# Patient Record
Sex: Male | Born: 1960
Health system: Southern US, Community
[De-identification: ages and names within clinical notes are randomized; demographics above are authoritative.]

---

## 2007-07-28 ENCOUNTER — Ambulatory Visit: Payer: Self-pay | Admitting: Internal Medicine

## 2007-07-28 DIAGNOSIS — R0602 Shortness of breath: Secondary | ICD-10-CM | POA: Insufficient documentation

## 2007-07-28 DIAGNOSIS — M6790 Unspecified disorder of synovium and tendon, unspecified site: Secondary | ICD-10-CM | POA: Insufficient documentation

## 2007-07-28 DIAGNOSIS — M25569 Pain in unspecified knee: Secondary | ICD-10-CM | POA: Insufficient documentation

## 2007-07-28 DIAGNOSIS — R5381 Other malaise: Secondary | ICD-10-CM

## 2007-07-28 DIAGNOSIS — K219 Gastro-esophageal reflux disease without esophagitis: Secondary | ICD-10-CM

## 2007-07-28 DIAGNOSIS — R5383 Other fatigue: Secondary | ICD-10-CM

## 2007-07-28 DIAGNOSIS — M719 Bursopathy, unspecified: Secondary | ICD-10-CM

## 2007-07-28 LAB — CONVERTED CEMR LAB: Blood Glucose, Fingerstick: 90

## 2007-07-29 ENCOUNTER — Encounter (INDEPENDENT_AMBULATORY_CARE_PROVIDER_SITE_OTHER): Payer: Self-pay | Admitting: Internal Medicine

## 2007-08-01 ENCOUNTER — Ambulatory Visit: Admission: RE | Admit: 2007-08-01 | Discharge: 2007-08-01 | Payer: Self-pay | Admitting: Internal Medicine

## 2007-08-01 ENCOUNTER — Encounter (INDEPENDENT_AMBULATORY_CARE_PROVIDER_SITE_OTHER): Payer: Self-pay | Admitting: Internal Medicine

## 2007-08-02 LAB — CONVERTED CEMR LAB
Chloride: 104 meq/L (ref 96–112)
Cholesterol: 162 mg/dL (ref 0–200)
HCT: 52.1 % — ABNORMAL HIGH (ref 39.0–52.0)
Hemoglobin: 16.2 g/dL (ref 13.0–17.0)
Lymphocytes Relative: 25 % (ref 12–46)
Lymphs Abs: 2.5 10*3/uL (ref 0.7–4.0)
MCHC: 31.1 g/dL (ref 30.0–36.0)
MCV: 94.4 fL (ref 78.0–100.0)
Monocytes Absolute: 0.8 10*3/uL (ref 0.1–1.0)
Monocytes Relative: 8 % (ref 3–12)
Neutrophils Relative %: 64 % (ref 43–77)
Platelets: 241 10*3/uL (ref 150–400)
Sodium: 138 meq/L (ref 135–145)
Triglycerides: 395 mg/dL — ABNORMAL HIGH (ref <150)

## 2007-08-16 ENCOUNTER — Ambulatory Visit: Payer: Self-pay | Admitting: Pulmonary Disease

## 2007-08-17 ENCOUNTER — Telehealth (INDEPENDENT_AMBULATORY_CARE_PROVIDER_SITE_OTHER): Payer: Self-pay | Admitting: *Deleted

## 2007-08-24 ENCOUNTER — Telehealth (INDEPENDENT_AMBULATORY_CARE_PROVIDER_SITE_OTHER): Payer: Self-pay | Admitting: *Deleted

## 2007-08-30 ENCOUNTER — Encounter (INDEPENDENT_AMBULATORY_CARE_PROVIDER_SITE_OTHER): Payer: Self-pay | Admitting: Internal Medicine

## 2007-11-03 ENCOUNTER — Telehealth (INDEPENDENT_AMBULATORY_CARE_PROVIDER_SITE_OTHER): Payer: Self-pay | Admitting: *Deleted

## 2007-11-05 ENCOUNTER — Ambulatory Visit: Payer: Self-pay | Admitting: Internal Medicine

## 2007-11-05 DIAGNOSIS — R21 Rash and other nonspecific skin eruption: Secondary | ICD-10-CM

## 2007-11-10 ENCOUNTER — Encounter (INDEPENDENT_AMBULATORY_CARE_PROVIDER_SITE_OTHER): Payer: Self-pay | Admitting: Internal Medicine

## 2007-11-12 ENCOUNTER — Ambulatory Visit (HOSPITAL_COMMUNITY): Admission: RE | Admit: 2007-11-12 | Discharge: 2007-11-12 | Payer: Self-pay | Admitting: Orthopaedic Surgery

## 2007-12-17 ENCOUNTER — Encounter (INDEPENDENT_AMBULATORY_CARE_PROVIDER_SITE_OTHER): Payer: Self-pay | Admitting: Internal Medicine

## 2008-01-26 ENCOUNTER — Ambulatory Visit: Payer: Self-pay | Admitting: Internal Medicine

## 2008-02-07 ENCOUNTER — Encounter (INDEPENDENT_AMBULATORY_CARE_PROVIDER_SITE_OTHER): Payer: Self-pay | Admitting: Internal Medicine

## 2008-08-18 ENCOUNTER — Encounter (INDEPENDENT_AMBULATORY_CARE_PROVIDER_SITE_OTHER): Payer: Self-pay | Admitting: Internal Medicine

## 2008-09-19 ENCOUNTER — Encounter: Admission: RE | Admit: 2008-09-19 | Discharge: 2008-12-18 | Payer: Self-pay | Admitting: Physician Assistant

## 2009-04-02 ENCOUNTER — Emergency Department (HOSPITAL_COMMUNITY): Admission: EM | Admit: 2009-04-02 | Discharge: 2009-04-02 | Payer: Self-pay | Admitting: Emergency Medicine

## 2010-07-09 NOTE — Procedures (Signed)
[name], Gabriel Dean                ACCOUNT NO.:  [id number]   MEDICAL RECORD NO.:  [id number]          PATIENT TYPE:  OUT   LOCATION:  SLEEP LAB                     FACILITY:  [facility]   PHYSICIAN:  Barbaraann Share, MD,FCCPDATE OF BIRTH:  01/07/60   DATE OF STUDY:  08/01/2007                            NOCTURNAL POLYSOMNOGRAM   REFERRING PHYSICIAN:   REFERRING PHYSICIAN:  Dr. Erle Crocker   INDICATION FOR STUDY:  Hypersomnia with sleep apnea.   EPWORTH SLEEPINESS SCORE:  Epworth score:  19.   SLEEP ARCHITECTURE:  The patient was found to have a total sleep time of  93 minutes during the diagnostic portion of the study, and 209 minutes  during the titration portion.  There was no slow wave, sleep or REM  achieved during the diagnostic portion and small quantities noted during  the titration portion.  Sleep onset latency was normal at 25 minutes and  sleep efficiency was poor during both the diagnostic and titration  portion.   RESPIRATORY DATA:  The patient underwent a split night protocol where he  was found to have 35 obstructive events in the first 93 minutes of  sleep.  This gave him an apnea/hypopnea index of 23 events per hour  during the diagnostic portion.  Very loud snoring was noted and the  events were not positional.  By protocol the patient was then placed on  a medium Quattro full-face mask, and ultimately titrated to a final  pressure of 8 cm.  Tolerance appeared to be excellent.   OXYGEN DATA:  The patient had O2 desaturation transiently as low as 89%  with the obstructive events.   CARDIAC DATA:  The patient was found to have a few runs of PVCs versus  ST-T with aberrancy.  There were no clinically significant arrhythmias  noted.   MOVEMENT-PARASOMNIA:  The patient had no clinically significant leg  jerks or abnormal behaviors.   IMPRESSIONS-RECOMMENDATIONS:  1. Moderate obstructive sleep apnea/hypopnea syndrome with an      apnea/hypopnea index of 23  events per hour and O2 desaturation as      low as 89% during the diagnostic portion of the study.  The patient      was then placed on CPAP with a medium Quattro full-face mask and      ultimately titrated to a final pressure of 8 cm of water.  The      patient should also be encouraged to work aggressively on weight      loss.  2. Occasional runs of PVCs versus SVT with aberrancy, but no      clinically significant arrhythmia noted.      Barbaraann Share, MD,FCCP  Diplomate, American Board of Sleep  Medicine  Electronically Signed     [facility]  D:  08/17/2007 08:14:17  T:  08/17/2007 08:29:02  Job:  [id number]
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
## 2019-08-18 DIAGNOSIS — I872 Venous insufficiency (chronic) (peripheral): Secondary | ICD-10-CM | POA: Diagnosis not present

## 2019-08-18 DIAGNOSIS — Z125 Encounter for screening for malignant neoplasm of prostate: Secondary | ICD-10-CM | POA: Diagnosis not present

## 2019-08-18 DIAGNOSIS — R739 Hyperglycemia, unspecified: Secondary | ICD-10-CM | POA: Diagnosis not present

## 2019-08-18 DIAGNOSIS — Z87891 Personal history of nicotine dependence: Secondary | ICD-10-CM | POA: Diagnosis not present

## 2019-08-18 DIAGNOSIS — R609 Edema, unspecified: Secondary | ICD-10-CM | POA: Diagnosis not present

## 2020-04-03 DIAGNOSIS — G8929 Other chronic pain: Secondary | ICD-10-CM | POA: Diagnosis not present

## 2020-04-03 DIAGNOSIS — Z809 Family history of malignant neoplasm, unspecified: Secondary | ICD-10-CM | POA: Diagnosis not present

## 2020-04-03 DIAGNOSIS — Z6838 Body mass index (BMI) 38.0-38.9, adult: Secondary | ICD-10-CM | POA: Diagnosis not present

## 2020-04-03 DIAGNOSIS — E119 Type 2 diabetes mellitus without complications: Secondary | ICD-10-CM | POA: Diagnosis not present

## 2020-04-03 DIAGNOSIS — Z8249 Family history of ischemic heart disease and other diseases of the circulatory system: Secondary | ICD-10-CM | POA: Diagnosis not present

## 2020-04-03 DIAGNOSIS — R609 Edema, unspecified: Secondary | ICD-10-CM | POA: Diagnosis not present

## 2020-04-03 DIAGNOSIS — R030 Elevated blood-pressure reading, without diagnosis of hypertension: Secondary | ICD-10-CM | POA: Diagnosis not present

## 2020-04-03 DIAGNOSIS — R69 Illness, unspecified: Secondary | ICD-10-CM | POA: Diagnosis not present

## 2020-04-03 DIAGNOSIS — Z7984 Long term (current) use of oral hypoglycemic drugs: Secondary | ICD-10-CM | POA: Diagnosis not present

## 2020-05-10 DIAGNOSIS — E785 Hyperlipidemia, unspecified: Secondary | ICD-10-CM | POA: Diagnosis not present

## 2020-05-10 DIAGNOSIS — Z125 Encounter for screening for malignant neoplasm of prostate: Secondary | ICD-10-CM | POA: Diagnosis not present

## 2020-05-10 DIAGNOSIS — Z0000 Encounter for general adult medical examination without abnormal findings: Secondary | ICD-10-CM | POA: Diagnosis not present

## 2020-05-10 DIAGNOSIS — E119 Type 2 diabetes mellitus without complications: Secondary | ICD-10-CM | POA: Diagnosis not present

## 2020-05-28 DIAGNOSIS — R0602 Shortness of breath: Secondary | ICD-10-CM | POA: Diagnosis not present

## 2020-05-28 DIAGNOSIS — R079 Chest pain, unspecified: Secondary | ICD-10-CM | POA: Diagnosis not present

## 2020-05-28 DIAGNOSIS — K219 Gastro-esophageal reflux disease without esophagitis: Secondary | ICD-10-CM | POA: Diagnosis not present

## 2020-05-28 DIAGNOSIS — M810 Age-related osteoporosis without current pathological fracture: Secondary | ICD-10-CM | POA: Diagnosis not present

## 2020-05-28 DIAGNOSIS — R0789 Other chest pain: Secondary | ICD-10-CM | POA: Diagnosis not present

## 2020-05-28 DIAGNOSIS — Z7984 Long term (current) use of oral hypoglycemic drugs: Secondary | ICD-10-CM | POA: Diagnosis not present

## 2020-05-28 DIAGNOSIS — R5383 Other fatigue: Secondary | ICD-10-CM | POA: Diagnosis not present

## 2020-05-28 DIAGNOSIS — I10 Essential (primary) hypertension: Secondary | ICD-10-CM | POA: Diagnosis not present

## 2020-05-28 DIAGNOSIS — R600 Localized edema: Secondary | ICD-10-CM | POA: Diagnosis not present

## 2020-05-28 DIAGNOSIS — E1140 Type 2 diabetes mellitus with diabetic neuropathy, unspecified: Secondary | ICD-10-CM | POA: Diagnosis not present

## 2020-05-28 DIAGNOSIS — E119 Type 2 diabetes mellitus without complications: Secondary | ICD-10-CM | POA: Diagnosis not present

## 2020-05-28 DIAGNOSIS — Z6841 Body Mass Index (BMI) 40.0 and over, adult: Secondary | ICD-10-CM | POA: Diagnosis not present

## 2020-05-28 DIAGNOSIS — Z79899 Other long term (current) drug therapy: Secondary | ICD-10-CM | POA: Diagnosis not present

## 2020-05-28 DIAGNOSIS — E669 Obesity, unspecified: Secondary | ICD-10-CM | POA: Diagnosis not present

## 2020-05-29 DIAGNOSIS — R079 Chest pain, unspecified: Secondary | ICD-10-CM | POA: Diagnosis not present

## 2020-05-30 DIAGNOSIS — R079 Chest pain, unspecified: Secondary | ICD-10-CM | POA: Diagnosis not present

## 2020-06-04 DIAGNOSIS — E1169 Type 2 diabetes mellitus with other specified complication: Secondary | ICD-10-CM | POA: Diagnosis not present

## 2020-06-04 DIAGNOSIS — I10 Essential (primary) hypertension: Secondary | ICD-10-CM | POA: Diagnosis not present

## 2020-06-04 DIAGNOSIS — R079 Chest pain, unspecified: Secondary | ICD-10-CM | POA: Diagnosis not present

## 2020-06-04 DIAGNOSIS — E785 Hyperlipidemia, unspecified: Secondary | ICD-10-CM | POA: Diagnosis not present

## 2020-06-14 DIAGNOSIS — E785 Hyperlipidemia, unspecified: Secondary | ICD-10-CM | POA: Diagnosis not present

## 2020-06-14 DIAGNOSIS — E1169 Type 2 diabetes mellitus with other specified complication: Secondary | ICD-10-CM | POA: Diagnosis not present

## 2020-06-14 DIAGNOSIS — I10 Essential (primary) hypertension: Secondary | ICD-10-CM | POA: Diagnosis not present

## 2020-06-14 DIAGNOSIS — R609 Edema, unspecified: Secondary | ICD-10-CM | POA: Diagnosis not present

## 2020-06-14 DIAGNOSIS — R079 Chest pain, unspecified: Secondary | ICD-10-CM | POA: Diagnosis not present

## 2020-06-27 DIAGNOSIS — I2510 Atherosclerotic heart disease of native coronary artery without angina pectoris: Secondary | ICD-10-CM | POA: Diagnosis not present

## 2020-06-27 DIAGNOSIS — I2583 Coronary atherosclerosis due to lipid rich plaque: Secondary | ICD-10-CM | POA: Diagnosis not present

## 2020-06-27 DIAGNOSIS — E785 Hyperlipidemia, unspecified: Secondary | ICD-10-CM | POA: Diagnosis not present

## 2020-06-27 DIAGNOSIS — R079 Chest pain, unspecified: Secondary | ICD-10-CM | POA: Diagnosis not present

## 2020-06-27 DIAGNOSIS — E1169 Type 2 diabetes mellitus with other specified complication: Secondary | ICD-10-CM | POA: Diagnosis not present

## 2020-06-27 DIAGNOSIS — I10 Essential (primary) hypertension: Secondary | ICD-10-CM | POA: Diagnosis not present

## 2020-06-27 DIAGNOSIS — R911 Solitary pulmonary nodule: Secondary | ICD-10-CM | POA: Diagnosis not present

## 2020-07-03 DIAGNOSIS — M1990 Unspecified osteoarthritis, unspecified site: Secondary | ICD-10-CM | POA: Diagnosis not present

## 2020-07-03 DIAGNOSIS — E785 Hyperlipidemia, unspecified: Secondary | ICD-10-CM | POA: Diagnosis not present

## 2020-07-03 DIAGNOSIS — Z7984 Long term (current) use of oral hypoglycemic drugs: Secondary | ICD-10-CM | POA: Diagnosis not present

## 2020-07-03 DIAGNOSIS — R0789 Other chest pain: Secondary | ICD-10-CM | POA: Diagnosis not present

## 2020-07-03 DIAGNOSIS — R69 Illness, unspecified: Secondary | ICD-10-CM | POA: Diagnosis not present

## 2020-07-03 DIAGNOSIS — I2510 Atherosclerotic heart disease of native coronary artery without angina pectoris: Secondary | ICD-10-CM | POA: Diagnosis not present

## 2020-07-03 DIAGNOSIS — Z7982 Long term (current) use of aspirin: Secondary | ICD-10-CM | POA: Diagnosis not present

## 2020-07-03 DIAGNOSIS — Z79899 Other long term (current) drug therapy: Secondary | ICD-10-CM | POA: Diagnosis not present

## 2020-07-03 DIAGNOSIS — Z96651 Presence of right artificial knee joint: Secondary | ICD-10-CM | POA: Diagnosis not present

## 2020-07-03 DIAGNOSIS — E119 Type 2 diabetes mellitus without complications: Secondary | ICD-10-CM | POA: Diagnosis not present

## 2020-07-04 DIAGNOSIS — Z8719 Personal history of other diseases of the digestive system: Secondary | ICD-10-CM | POA: Diagnosis not present

## 2020-07-04 DIAGNOSIS — R69 Illness, unspecified: Secondary | ICD-10-CM | POA: Diagnosis not present

## 2020-07-04 DIAGNOSIS — Z79899 Other long term (current) drug therapy: Secondary | ICD-10-CM | POA: Diagnosis not present

## 2020-07-04 DIAGNOSIS — K219 Gastro-esophageal reflux disease without esophagitis: Secondary | ICD-10-CM | POA: Diagnosis not present

## 2020-07-04 DIAGNOSIS — Z7984 Long term (current) use of oral hypoglycemic drugs: Secondary | ICD-10-CM | POA: Diagnosis not present

## 2020-07-04 DIAGNOSIS — E1169 Type 2 diabetes mellitus with other specified complication: Secondary | ICD-10-CM | POA: Diagnosis not present

## 2020-07-04 DIAGNOSIS — M1990 Unspecified osteoarthritis, unspecified site: Secondary | ICD-10-CM | POA: Diagnosis not present

## 2020-07-04 DIAGNOSIS — E119 Type 2 diabetes mellitus without complications: Secondary | ICD-10-CM | POA: Diagnosis not present

## 2020-07-04 DIAGNOSIS — E785 Hyperlipidemia, unspecified: Secondary | ICD-10-CM | POA: Diagnosis not present

## 2020-07-04 DIAGNOSIS — Z87891 Personal history of nicotine dependence: Secondary | ICD-10-CM | POA: Diagnosis not present

## 2020-07-04 DIAGNOSIS — R0789 Other chest pain: Secondary | ICD-10-CM | POA: Diagnosis not present

## 2020-07-04 DIAGNOSIS — I2510 Atherosclerotic heart disease of native coronary artery without angina pectoris: Secondary | ICD-10-CM | POA: Diagnosis not present

## 2020-07-04 DIAGNOSIS — Z96651 Presence of right artificial knee joint: Secondary | ICD-10-CM | POA: Diagnosis not present

## 2020-07-04 DIAGNOSIS — I10 Essential (primary) hypertension: Secondary | ICD-10-CM | POA: Diagnosis not present

## 2020-07-04 DIAGNOSIS — Z7982 Long term (current) use of aspirin: Secondary | ICD-10-CM | POA: Diagnosis not present

## 2020-07-06 DIAGNOSIS — R609 Edema, unspecified: Secondary | ICD-10-CM | POA: Diagnosis not present

## 2020-08-03 DIAGNOSIS — E785 Hyperlipidemia, unspecified: Secondary | ICD-10-CM | POA: Diagnosis not present

## 2020-08-03 DIAGNOSIS — E1169 Type 2 diabetes mellitus with other specified complication: Secondary | ICD-10-CM | POA: Diagnosis not present

## 2020-08-07 DIAGNOSIS — R079 Chest pain, unspecified: Secondary | ICD-10-CM | POA: Diagnosis not present

## 2020-08-07 DIAGNOSIS — I2510 Atherosclerotic heart disease of native coronary artery without angina pectoris: Secondary | ICD-10-CM | POA: Diagnosis not present

## 2020-08-07 DIAGNOSIS — E785 Hyperlipidemia, unspecified: Secondary | ICD-10-CM | POA: Diagnosis not present

## 2020-08-07 DIAGNOSIS — I10 Essential (primary) hypertension: Secondary | ICD-10-CM | POA: Diagnosis not present

## 2020-08-07 DIAGNOSIS — E1169 Type 2 diabetes mellitus with other specified complication: Secondary | ICD-10-CM | POA: Diagnosis not present

## 2020-12-03 DIAGNOSIS — I10 Essential (primary) hypertension: Secondary | ICD-10-CM | POA: Diagnosis not present

## 2020-12-03 DIAGNOSIS — E1169 Type 2 diabetes mellitus with other specified complication: Secondary | ICD-10-CM | POA: Diagnosis not present

## 2020-12-03 DIAGNOSIS — E785 Hyperlipidemia, unspecified: Secondary | ICD-10-CM | POA: Diagnosis not present

## 2020-12-03 DIAGNOSIS — I2510 Atherosclerotic heart disease of native coronary artery without angina pectoris: Secondary | ICD-10-CM | POA: Diagnosis not present

## 2021-02-27 DIAGNOSIS — Z0000 Encounter for general adult medical examination without abnormal findings: Secondary | ICD-10-CM | POA: Diagnosis not present

## 2021-02-27 DIAGNOSIS — M5416 Radiculopathy, lumbar region: Secondary | ICD-10-CM | POA: Diagnosis not present

## 2021-02-27 DIAGNOSIS — E785 Hyperlipidemia, unspecified: Secondary | ICD-10-CM | POA: Diagnosis not present

## 2021-02-27 DIAGNOSIS — I10 Essential (primary) hypertension: Secondary | ICD-10-CM | POA: Diagnosis not present

## 2021-02-27 DIAGNOSIS — E1169 Type 2 diabetes mellitus with other specified complication: Secondary | ICD-10-CM | POA: Diagnosis not present

## 2021-02-27 DIAGNOSIS — Z87891 Personal history of nicotine dependence: Secondary | ICD-10-CM | POA: Diagnosis not present

## 2021-02-27 DIAGNOSIS — Z122 Encounter for screening for malignant neoplasm of respiratory organs: Secondary | ICD-10-CM | POA: Diagnosis not present

## 2021-03-21 DIAGNOSIS — Z87891 Personal history of nicotine dependence: Secondary | ICD-10-CM | POA: Diagnosis not present

## 2021-03-21 DIAGNOSIS — Z122 Encounter for screening for malignant neoplasm of respiratory organs: Secondary | ICD-10-CM | POA: Diagnosis not present

## 2021-05-15 DIAGNOSIS — Z6841 Body Mass Index (BMI) 40.0 and over, adult: Secondary | ICD-10-CM | POA: Diagnosis not present

## 2021-05-15 DIAGNOSIS — I10 Essential (primary) hypertension: Secondary | ICD-10-CM | POA: Diagnosis not present

## 2021-05-15 DIAGNOSIS — E1151 Type 2 diabetes mellitus with diabetic peripheral angiopathy without gangrene: Secondary | ICD-10-CM | POA: Diagnosis not present

## 2021-05-15 DIAGNOSIS — R609 Edema, unspecified: Secondary | ICD-10-CM | POA: Diagnosis not present

## 2021-05-15 DIAGNOSIS — E1140 Type 2 diabetes mellitus with diabetic neuropathy, unspecified: Secondary | ICD-10-CM | POA: Diagnosis not present

## 2021-05-15 DIAGNOSIS — Z7902 Long term (current) use of antithrombotics/antiplatelets: Secondary | ICD-10-CM | POA: Diagnosis not present

## 2021-05-15 DIAGNOSIS — E785 Hyperlipidemia, unspecified: Secondary | ICD-10-CM | POA: Diagnosis not present

## 2021-05-15 DIAGNOSIS — Z720 Tobacco use: Secondary | ICD-10-CM | POA: Diagnosis not present

## 2021-05-15 DIAGNOSIS — G8929 Other chronic pain: Secondary | ICD-10-CM | POA: Diagnosis not present

## 2021-05-15 DIAGNOSIS — Z596 Low income: Secondary | ICD-10-CM | POA: Diagnosis not present

## 2021-05-15 DIAGNOSIS — I25119 Atherosclerotic heart disease of native coronary artery with unspecified angina pectoris: Secondary | ICD-10-CM | POA: Diagnosis not present

## 2021-06-04 DIAGNOSIS — E782 Mixed hyperlipidemia: Secondary | ICD-10-CM | POA: Diagnosis not present

## 2021-06-04 DIAGNOSIS — I10 Essential (primary) hypertension: Secondary | ICD-10-CM | POA: Diagnosis not present

## 2021-06-04 DIAGNOSIS — I739 Peripheral vascular disease, unspecified: Secondary | ICD-10-CM | POA: Diagnosis not present

## 2021-06-04 DIAGNOSIS — I2510 Atherosclerotic heart disease of native coronary artery without angina pectoris: Secondary | ICD-10-CM | POA: Diagnosis not present

## 2021-06-20 DIAGNOSIS — I739 Peripheral vascular disease, unspecified: Secondary | ICD-10-CM | POA: Diagnosis not present

## 2021-09-04 DIAGNOSIS — M5416 Radiculopathy, lumbar region: Secondary | ICD-10-CM | POA: Diagnosis not present

## 2021-09-04 DIAGNOSIS — E1169 Type 2 diabetes mellitus with other specified complication: Secondary | ICD-10-CM | POA: Diagnosis not present

## 2021-09-04 DIAGNOSIS — M25552 Pain in left hip: Secondary | ICD-10-CM | POA: Diagnosis not present

## 2021-09-04 DIAGNOSIS — I10 Essential (primary) hypertension: Secondary | ICD-10-CM | POA: Diagnosis not present

## 2021-09-04 DIAGNOSIS — E785 Hyperlipidemia, unspecified: Secondary | ICD-10-CM | POA: Diagnosis not present

## 2021-09-04 DIAGNOSIS — Z0000 Encounter for general adult medical examination without abnormal findings: Secondary | ICD-10-CM | POA: Diagnosis not present

## 2021-09-23 DIAGNOSIS — M545 Low back pain, unspecified: Secondary | ICD-10-CM | POA: Diagnosis not present

## 2021-09-23 DIAGNOSIS — M5416 Radiculopathy, lumbar region: Secondary | ICD-10-CM | POA: Diagnosis not present

## 2021-09-23 DIAGNOSIS — M25552 Pain in left hip: Secondary | ICD-10-CM | POA: Diagnosis not present

## 2021-10-01 DIAGNOSIS — M6289 Other specified disorders of muscle: Secondary | ICD-10-CM | POA: Diagnosis not present

## 2021-10-01 DIAGNOSIS — M5416 Radiculopathy, lumbar region: Secondary | ICD-10-CM | POA: Diagnosis not present

## 2021-10-01 DIAGNOSIS — R6889 Other general symptoms and signs: Secondary | ICD-10-CM | POA: Diagnosis not present

## 2021-10-01 DIAGNOSIS — R293 Abnormal posture: Secondary | ICD-10-CM | POA: Diagnosis not present

## 2021-10-01 DIAGNOSIS — R2689 Other abnormalities of gait and mobility: Secondary | ICD-10-CM | POA: Diagnosis not present

## 2021-10-01 DIAGNOSIS — M25552 Pain in left hip: Secondary | ICD-10-CM | POA: Diagnosis not present

## 2021-10-01 DIAGNOSIS — R262 Difficulty in walking, not elsewhere classified: Secondary | ICD-10-CM | POA: Diagnosis not present

## 2021-10-01 DIAGNOSIS — Z723 Lack of physical exercise: Secondary | ICD-10-CM | POA: Diagnosis not present

## 2021-10-08 DIAGNOSIS — M5416 Radiculopathy, lumbar region: Secondary | ICD-10-CM | POA: Diagnosis not present

## 2021-10-08 DIAGNOSIS — R2689 Other abnormalities of gait and mobility: Secondary | ICD-10-CM | POA: Diagnosis not present

## 2021-10-08 DIAGNOSIS — R293 Abnormal posture: Secondary | ICD-10-CM | POA: Diagnosis not present

## 2021-10-08 DIAGNOSIS — R262 Difficulty in walking, not elsewhere classified: Secondary | ICD-10-CM | POA: Diagnosis not present

## 2021-10-08 DIAGNOSIS — M25552 Pain in left hip: Secondary | ICD-10-CM | POA: Diagnosis not present

## 2021-10-08 DIAGNOSIS — Z723 Lack of physical exercise: Secondary | ICD-10-CM | POA: Diagnosis not present

## 2021-10-08 DIAGNOSIS — M6289 Other specified disorders of muscle: Secondary | ICD-10-CM | POA: Diagnosis not present

## 2021-10-08 DIAGNOSIS — R6889 Other general symptoms and signs: Secondary | ICD-10-CM | POA: Diagnosis not present

## 2021-10-15 DIAGNOSIS — Z723 Lack of physical exercise: Secondary | ICD-10-CM | POA: Diagnosis not present

## 2021-10-15 DIAGNOSIS — M6289 Other specified disorders of muscle: Secondary | ICD-10-CM | POA: Diagnosis not present

## 2021-10-15 DIAGNOSIS — M25552 Pain in left hip: Secondary | ICD-10-CM | POA: Diagnosis not present

## 2021-10-15 DIAGNOSIS — R293 Abnormal posture: Secondary | ICD-10-CM | POA: Diagnosis not present

## 2021-10-15 DIAGNOSIS — R262 Difficulty in walking, not elsewhere classified: Secondary | ICD-10-CM | POA: Diagnosis not present

## 2021-10-15 DIAGNOSIS — R6889 Other general symptoms and signs: Secondary | ICD-10-CM | POA: Diagnosis not present

## 2021-10-15 DIAGNOSIS — M5416 Radiculopathy, lumbar region: Secondary | ICD-10-CM | POA: Diagnosis not present

## 2021-10-15 DIAGNOSIS — R2689 Other abnormalities of gait and mobility: Secondary | ICD-10-CM | POA: Diagnosis not present

## 2021-10-22 DIAGNOSIS — R6889 Other general symptoms and signs: Secondary | ICD-10-CM | POA: Diagnosis not present

## 2021-10-22 DIAGNOSIS — R293 Abnormal posture: Secondary | ICD-10-CM | POA: Diagnosis not present

## 2021-10-22 DIAGNOSIS — M6289 Other specified disorders of muscle: Secondary | ICD-10-CM | POA: Diagnosis not present

## 2021-10-22 DIAGNOSIS — Z723 Lack of physical exercise: Secondary | ICD-10-CM | POA: Diagnosis not present

## 2021-10-22 DIAGNOSIS — M25552 Pain in left hip: Secondary | ICD-10-CM | POA: Diagnosis not present

## 2021-10-22 DIAGNOSIS — R262 Difficulty in walking, not elsewhere classified: Secondary | ICD-10-CM | POA: Diagnosis not present

## 2021-10-22 DIAGNOSIS — R2689 Other abnormalities of gait and mobility: Secondary | ICD-10-CM | POA: Diagnosis not present

## 2021-10-22 DIAGNOSIS — M5416 Radiculopathy, lumbar region: Secondary | ICD-10-CM | POA: Diagnosis not present

## 2021-10-31 DIAGNOSIS — R262 Difficulty in walking, not elsewhere classified: Secondary | ICD-10-CM | POA: Diagnosis not present

## 2021-10-31 DIAGNOSIS — R29898 Other symptoms and signs involving the musculoskeletal system: Secondary | ICD-10-CM | POA: Diagnosis not present

## 2021-10-31 DIAGNOSIS — M5416 Radiculopathy, lumbar region: Secondary | ICD-10-CM | POA: Diagnosis not present

## 2021-10-31 DIAGNOSIS — M25552 Pain in left hip: Secondary | ICD-10-CM | POA: Diagnosis not present

## 2021-11-06 DIAGNOSIS — R262 Difficulty in walking, not elsewhere classified: Secondary | ICD-10-CM | POA: Diagnosis not present

## 2021-11-06 DIAGNOSIS — M5416 Radiculopathy, lumbar region: Secondary | ICD-10-CM | POA: Diagnosis not present

## 2021-11-06 DIAGNOSIS — R29898 Other symptoms and signs involving the musculoskeletal system: Secondary | ICD-10-CM | POA: Diagnosis not present

## 2021-11-06 DIAGNOSIS — M25552 Pain in left hip: Secondary | ICD-10-CM | POA: Diagnosis not present

## 2021-11-13 DIAGNOSIS — M5416 Radiculopathy, lumbar region: Secondary | ICD-10-CM | POA: Diagnosis not present

## 2021-11-13 DIAGNOSIS — R262 Difficulty in walking, not elsewhere classified: Secondary | ICD-10-CM | POA: Diagnosis not present

## 2021-11-13 DIAGNOSIS — M25552 Pain in left hip: Secondary | ICD-10-CM | POA: Diagnosis not present

## 2021-11-13 DIAGNOSIS — R29898 Other symptoms and signs involving the musculoskeletal system: Secondary | ICD-10-CM | POA: Diagnosis not present

## 2021-11-18 DIAGNOSIS — R29898 Other symptoms and signs involving the musculoskeletal system: Secondary | ICD-10-CM | POA: Diagnosis not present

## 2021-11-18 DIAGNOSIS — M5416 Radiculopathy, lumbar region: Secondary | ICD-10-CM | POA: Diagnosis not present

## 2021-11-18 DIAGNOSIS — M25552 Pain in left hip: Secondary | ICD-10-CM | POA: Diagnosis not present

## 2021-11-18 DIAGNOSIS — R262 Difficulty in walking, not elsewhere classified: Secondary | ICD-10-CM | POA: Diagnosis not present

## 2021-11-20 ENCOUNTER — Telehealth: Payer: Self-pay | Admitting: *Deleted

## 2021-11-20 NOTE — Patient Outreach (Signed)
  Care Coordination   11/20/2021 Name: Gabriel Dean MRN: [id number] DOB: 1961/02/19   Care Coordination Outreach Attempts:  An unsuccessful telephone outreach was attempted today to offer the patient information about available care coordination services as a benefit of their health plan.   Follow Up Plan:  Additional outreach attempts will be made to offer the patient care coordination information and services.   Encounter Outcome:  No Answer  Care Coordination Interventions Activated:  No   Care Coordination Interventions:  No, not indicated    Jacqlyn Larsen [facility], [place] RN Care Coordinator [phone]

## 2021-11-21 ENCOUNTER — Telehealth: Payer: Self-pay | Admitting: *Deleted

## 2021-11-21 NOTE — Patient Outreach (Signed)
  Care Coordination   11/21/2021 Name: Gabriel Dean MRN: [id number] DOB: Sep 07, 1960   Care Coordination Outreach Attempts:  A second unsuccessful outreach was attempted today to offer the patient with information about available care coordination services as a benefit of their health plan.     Follow Up Plan:  Additional outreach attempts will be made to offer the patient care coordination information and services.   Encounter Outcome:  No Answer  Care Coordination Interventions Activated:  No   Care Coordination Interventions:  No, not indicated    Jacqlyn Larsen [facility], [place] RN Care Coordinator [phone]

## 2021-11-25 ENCOUNTER — Telehealth: Payer: Self-pay | Admitting: *Deleted

## 2021-11-25 NOTE — Patient Outreach (Signed)
  Care Coordination   11/25/2021 Name: Gabriel Dean MRN: [id number] DOB: September 15, 1960   Care Coordination Outreach Attempts:  A third unsuccessful outreach was attempted today to offer the patient with information about available care coordination services as a benefit of their health plan.   Follow Up Plan:  No further outreach attempts will be made at this time. We have been unable to contact the patient to offer or enroll patient in care coordination services  Encounter Outcome:  No Answer  Care Coordination Interventions Activated:  No   Care Coordination Interventions:  No, not indicated    Jacqlyn Larsen [facility], BSN [facility] RN Care Coordinator [phone]

## 2021-11-26 DIAGNOSIS — M25552 Pain in left hip: Secondary | ICD-10-CM | POA: Diagnosis not present

## 2021-11-26 DIAGNOSIS — R293 Abnormal posture: Secondary | ICD-10-CM | POA: Diagnosis not present

## 2021-11-26 DIAGNOSIS — M5416 Radiculopathy, lumbar region: Secondary | ICD-10-CM | POA: Diagnosis not present

## 2021-11-26 DIAGNOSIS — M6281 Muscle weakness (generalized): Secondary | ICD-10-CM | POA: Diagnosis not present

## 2021-11-26 DIAGNOSIS — R2689 Other abnormalities of gait and mobility: Secondary | ICD-10-CM | POA: Diagnosis not present

## 2021-11-26 DIAGNOSIS — Z7409 Other reduced mobility: Secondary | ICD-10-CM | POA: Diagnosis not present

## 2021-12-03 DIAGNOSIS — M5416 Radiculopathy, lumbar region: Secondary | ICD-10-CM | POA: Diagnosis not present

## 2021-12-03 DIAGNOSIS — Z7409 Other reduced mobility: Secondary | ICD-10-CM | POA: Diagnosis not present

## 2021-12-03 DIAGNOSIS — M25552 Pain in left hip: Secondary | ICD-10-CM | POA: Diagnosis not present

## 2021-12-03 DIAGNOSIS — R2689 Other abnormalities of gait and mobility: Secondary | ICD-10-CM | POA: Diagnosis not present

## 2021-12-03 DIAGNOSIS — R293 Abnormal posture: Secondary | ICD-10-CM | POA: Diagnosis not present

## 2021-12-03 DIAGNOSIS — M6281 Muscle weakness (generalized): Secondary | ICD-10-CM | POA: Diagnosis not present

## 2021-12-09 DIAGNOSIS — Z87891 Personal history of nicotine dependence: Secondary | ICD-10-CM | POA: Diagnosis not present

## 2021-12-09 DIAGNOSIS — I10 Essential (primary) hypertension: Secondary | ICD-10-CM | POA: Diagnosis not present

## 2021-12-09 DIAGNOSIS — I2510 Atherosclerotic heart disease of native coronary artery without angina pectoris: Secondary | ICD-10-CM | POA: Diagnosis not present

## 2021-12-09 DIAGNOSIS — R079 Chest pain, unspecified: Secondary | ICD-10-CM | POA: Diagnosis not present

## 2021-12-09 DIAGNOSIS — E782 Mixed hyperlipidemia: Secondary | ICD-10-CM | POA: Diagnosis not present

## 2021-12-10 DIAGNOSIS — M6281 Muscle weakness (generalized): Secondary | ICD-10-CM | POA: Diagnosis not present

## 2021-12-10 DIAGNOSIS — R2689 Other abnormalities of gait and mobility: Secondary | ICD-10-CM | POA: Diagnosis not present

## 2021-12-10 DIAGNOSIS — M25552 Pain in left hip: Secondary | ICD-10-CM | POA: Diagnosis not present

## 2021-12-10 DIAGNOSIS — R293 Abnormal posture: Secondary | ICD-10-CM | POA: Diagnosis not present

## 2021-12-10 DIAGNOSIS — M5416 Radiculopathy, lumbar region: Secondary | ICD-10-CM | POA: Diagnosis not present

## 2021-12-10 DIAGNOSIS — Z7409 Other reduced mobility: Secondary | ICD-10-CM | POA: Diagnosis not present

## 2022-03-27 DIAGNOSIS — E785 Hyperlipidemia, unspecified: Secondary | ICD-10-CM | POA: Diagnosis not present

## 2022-03-27 DIAGNOSIS — Z122 Encounter for screening for malignant neoplasm of respiratory organs: Secondary | ICD-10-CM | POA: Diagnosis not present

## 2022-03-27 DIAGNOSIS — Z1330 Encounter for screening examination for mental health and behavioral disorders, unspecified: Secondary | ICD-10-CM | POA: Diagnosis not present

## 2022-03-27 DIAGNOSIS — E1169 Type 2 diabetes mellitus with other specified complication: Secondary | ICD-10-CM | POA: Diagnosis not present

## 2022-03-27 DIAGNOSIS — Z0000 Encounter for general adult medical examination without abnormal findings: Secondary | ICD-10-CM | POA: Diagnosis not present

## 2022-03-27 DIAGNOSIS — Z125 Encounter for screening for malignant neoplasm of prostate: Secondary | ICD-10-CM | POA: Diagnosis not present

## 2022-03-27 DIAGNOSIS — L84 Corns and callosities: Secondary | ICD-10-CM | POA: Diagnosis not present

## 2022-04-11 DIAGNOSIS — Z87891 Personal history of nicotine dependence: Secondary | ICD-10-CM | POA: Diagnosis not present

## 2022-04-14 DIAGNOSIS — H25813 Combined forms of age-related cataract, bilateral: Secondary | ICD-10-CM | POA: Diagnosis not present

## 2022-04-14 DIAGNOSIS — E119 Type 2 diabetes mellitus without complications: Secondary | ICD-10-CM | POA: Diagnosis not present

## 2022-04-14 DIAGNOSIS — H524 Presbyopia: Secondary | ICD-10-CM | POA: Diagnosis not present

## 2022-04-14 DIAGNOSIS — H35372 Puckering of macula, left eye: Secondary | ICD-10-CM | POA: Diagnosis not present

## 2022-04-14 DIAGNOSIS — H52223 Regular astigmatism, bilateral: Secondary | ICD-10-CM | POA: Diagnosis not present

## 2022-04-14 DIAGNOSIS — H5203 Hypermetropia, bilateral: Secondary | ICD-10-CM | POA: Diagnosis not present

## 2022-04-21 DIAGNOSIS — Z87891 Personal history of nicotine dependence: Secondary | ICD-10-CM | POA: Diagnosis not present

## 2022-04-21 DIAGNOSIS — L84 Corns and callosities: Secondary | ICD-10-CM | POA: Diagnosis not present

## 2022-04-21 DIAGNOSIS — G629 Polyneuropathy, unspecified: Secondary | ICD-10-CM | POA: Diagnosis not present

## 2022-04-21 DIAGNOSIS — E1142 Type 2 diabetes mellitus with diabetic polyneuropathy: Secondary | ICD-10-CM | POA: Diagnosis not present

## 2022-04-21 DIAGNOSIS — M21622 Bunionette of left foot: Secondary | ICD-10-CM | POA: Diagnosis not present

## 2022-04-21 DIAGNOSIS — I739 Peripheral vascular disease, unspecified: Secondary | ICD-10-CM | POA: Diagnosis not present

## 2022-04-21 DIAGNOSIS — M21621 Bunionette of right foot: Secondary | ICD-10-CM | POA: Diagnosis not present

## 2022-04-21 DIAGNOSIS — M792 Neuralgia and neuritis, unspecified: Secondary | ICD-10-CM | POA: Diagnosis not present

## 2022-04-25 DIAGNOSIS — I739 Peripheral vascular disease, unspecified: Secondary | ICD-10-CM | POA: Diagnosis not present

## 2022-06-10 DIAGNOSIS — E1169 Type 2 diabetes mellitus with other specified complication: Secondary | ICD-10-CM | POA: Diagnosis not present

## 2022-06-10 DIAGNOSIS — I10 Essential (primary) hypertension: Secondary | ICD-10-CM | POA: Diagnosis not present

## 2022-06-10 DIAGNOSIS — Z87891 Personal history of nicotine dependence: Secondary | ICD-10-CM | POA: Diagnosis not present

## 2022-06-10 DIAGNOSIS — I2510 Atherosclerotic heart disease of native coronary artery without angina pectoris: Secondary | ICD-10-CM | POA: Diagnosis not present

## 2022-06-10 DIAGNOSIS — E785 Hyperlipidemia, unspecified: Secondary | ICD-10-CM | POA: Diagnosis not present

## 2022-07-24 DIAGNOSIS — E119 Type 2 diabetes mellitus without complications: Secondary | ICD-10-CM | POA: Diagnosis not present

## 2022-07-24 DIAGNOSIS — E785 Hyperlipidemia, unspecified: Secondary | ICD-10-CM | POA: Diagnosis not present

## 2022-07-24 DIAGNOSIS — M545 Low back pain, unspecified: Secondary | ICD-10-CM | POA: Diagnosis not present

## 2022-07-24 DIAGNOSIS — K219 Gastro-esophageal reflux disease without esophagitis: Secondary | ICD-10-CM | POA: Diagnosis not present

## 2022-07-24 DIAGNOSIS — Z8249 Family history of ischemic heart disease and other diseases of the circulatory system: Secondary | ICD-10-CM | POA: Diagnosis not present

## 2022-07-24 DIAGNOSIS — Z87891 Personal history of nicotine dependence: Secondary | ICD-10-CM | POA: Diagnosis not present

## 2022-07-24 DIAGNOSIS — I10 Essential (primary) hypertension: Secondary | ICD-10-CM | POA: Diagnosis not present

## 2022-07-24 DIAGNOSIS — M1990 Unspecified osteoarthritis, unspecified site: Secondary | ICD-10-CM | POA: Diagnosis not present

## 2022-07-24 DIAGNOSIS — R609 Edema, unspecified: Secondary | ICD-10-CM | POA: Diagnosis not present

## 2022-07-24 DIAGNOSIS — I25119 Atherosclerotic heart disease of native coronary artery with unspecified angina pectoris: Secondary | ICD-10-CM | POA: Diagnosis not present

## 2022-07-24 DIAGNOSIS — Z809 Family history of malignant neoplasm, unspecified: Secondary | ICD-10-CM | POA: Diagnosis not present

## 2022-08-21 DIAGNOSIS — I2510 Atherosclerotic heart disease of native coronary artery without angina pectoris: Secondary | ICD-10-CM | POA: Diagnosis not present

## 2022-08-21 DIAGNOSIS — E1169 Type 2 diabetes mellitus with other specified complication: Secondary | ICD-10-CM | POA: Diagnosis not present

## 2022-08-21 DIAGNOSIS — I10 Essential (primary) hypertension: Secondary | ICD-10-CM | POA: Diagnosis not present

## 2022-08-21 DIAGNOSIS — E785 Hyperlipidemia, unspecified: Secondary | ICD-10-CM | POA: Diagnosis not present

## 2022-10-08 DIAGNOSIS — E1169 Type 2 diabetes mellitus with other specified complication: Secondary | ICD-10-CM | POA: Diagnosis not present

## 2022-10-08 DIAGNOSIS — Z0000 Encounter for general adult medical examination without abnormal findings: Secondary | ICD-10-CM | POA: Diagnosis not present

## 2022-10-08 DIAGNOSIS — E785 Hyperlipidemia, unspecified: Secondary | ICD-10-CM | POA: Diagnosis not present

## 2022-10-08 DIAGNOSIS — I10 Essential (primary) hypertension: Secondary | ICD-10-CM | POA: Diagnosis not present

## 2022-12-10 DIAGNOSIS — Z87891 Personal history of nicotine dependence: Secondary | ICD-10-CM | POA: Diagnosis not present

## 2022-12-10 DIAGNOSIS — E1169 Type 2 diabetes mellitus with other specified complication: Secondary | ICD-10-CM | POA: Diagnosis not present

## 2022-12-10 DIAGNOSIS — E785 Hyperlipidemia, unspecified: Secondary | ICD-10-CM | POA: Diagnosis not present

## 2022-12-10 DIAGNOSIS — I2510 Atherosclerotic heart disease of native coronary artery without angina pectoris: Secondary | ICD-10-CM | POA: Diagnosis not present

## 2022-12-10 DIAGNOSIS — E781 Pure hyperglyceridemia: Secondary | ICD-10-CM | POA: Diagnosis not present

## 2022-12-10 DIAGNOSIS — I10 Essential (primary) hypertension: Secondary | ICD-10-CM | POA: Diagnosis not present

## 2023-04-20 DIAGNOSIS — Z79899 Other long term (current) drug therapy: Secondary | ICD-10-CM | POA: Diagnosis not present

## 2023-04-20 DIAGNOSIS — Z1330 Encounter for screening examination for mental health and behavioral disorders, unspecified: Secondary | ICD-10-CM | POA: Diagnosis not present

## 2023-04-20 DIAGNOSIS — E1169 Type 2 diabetes mellitus with other specified complication: Secondary | ICD-10-CM | POA: Diagnosis not present

## 2023-04-20 DIAGNOSIS — Z0000 Encounter for general adult medical examination without abnormal findings: Secondary | ICD-10-CM | POA: Diagnosis not present

## 2023-04-20 DIAGNOSIS — E66813 Obesity, class 3: Secondary | ICD-10-CM | POA: Diagnosis not present

## 2023-04-20 DIAGNOSIS — I10 Essential (primary) hypertension: Secondary | ICD-10-CM | POA: Diagnosis not present

## 2023-04-20 DIAGNOSIS — Z87891 Personal history of nicotine dependence: Secondary | ICD-10-CM | POA: Diagnosis not present

## 2023-04-20 DIAGNOSIS — E785 Hyperlipidemia, unspecified: Secondary | ICD-10-CM | POA: Diagnosis not present

## 2023-04-20 DIAGNOSIS — Z6841 Body Mass Index (BMI) 40.0 and over, adult: Secondary | ICD-10-CM | POA: Diagnosis not present

## 2023-04-20 DIAGNOSIS — Z125 Encounter for screening for malignant neoplasm of prostate: Secondary | ICD-10-CM | POA: Diagnosis not present

## 2023-05-23 DIAGNOSIS — Z87891 Personal history of nicotine dependence: Secondary | ICD-10-CM | POA: Diagnosis not present

## 2023-10-22 DIAGNOSIS — B360 Pityriasis versicolor: Secondary | ICD-10-CM | POA: Diagnosis not present

## 2023-10-22 DIAGNOSIS — E538 Deficiency of other specified B group vitamins: Secondary | ICD-10-CM | POA: Diagnosis not present

## 2023-10-22 DIAGNOSIS — E66813 Obesity, class 3: Secondary | ICD-10-CM | POA: Diagnosis not present

## 2023-10-22 DIAGNOSIS — E1169 Type 2 diabetes mellitus with other specified complication: Secondary | ICD-10-CM | POA: Diagnosis not present

## 2023-10-22 DIAGNOSIS — Z6841 Body Mass Index (BMI) 40.0 and over, adult: Secondary | ICD-10-CM | POA: Diagnosis not present

## 2023-10-22 DIAGNOSIS — E785 Hyperlipidemia, unspecified: Secondary | ICD-10-CM | POA: Diagnosis not present

## 2023-10-22 DIAGNOSIS — L84 Corns and callosities: Secondary | ICD-10-CM | POA: Diagnosis not present

## 2023-10-22 DIAGNOSIS — Z0000 Encounter for general adult medical examination without abnormal findings: Secondary | ICD-10-CM | POA: Diagnosis not present

## 2023-10-22 DIAGNOSIS — I10 Essential (primary) hypertension: Secondary | ICD-10-CM | POA: Diagnosis not present

## 2023-11-26 DIAGNOSIS — I10 Essential (primary) hypertension: Secondary | ICD-10-CM | POA: Diagnosis not present

## 2023-11-26 DIAGNOSIS — M21621 Bunionette of right foot: Secondary | ICD-10-CM | POA: Diagnosis not present

## 2023-11-26 DIAGNOSIS — R202 Paresthesia of skin: Secondary | ICD-10-CM | POA: Diagnosis not present

## 2023-11-26 DIAGNOSIS — E1169 Type 2 diabetes mellitus with other specified complication: Secondary | ICD-10-CM | POA: Diagnosis not present

## 2023-11-26 DIAGNOSIS — M79671 Pain in right foot: Secondary | ICD-10-CM | POA: Diagnosis not present

## 2023-11-26 DIAGNOSIS — M79672 Pain in left foot: Secondary | ICD-10-CM | POA: Diagnosis not present

## 2023-11-26 DIAGNOSIS — M21622 Bunionette of left foot: Secondary | ICD-10-CM | POA: Diagnosis not present

## 2023-11-26 DIAGNOSIS — E785 Hyperlipidemia, unspecified: Secondary | ICD-10-CM | POA: Diagnosis not present

## 2023-11-26 DIAGNOSIS — Z87891 Personal history of nicotine dependence: Secondary | ICD-10-CM | POA: Diagnosis not present

## 2023-12-09 DIAGNOSIS — M79672 Pain in left foot: Secondary | ICD-10-CM | POA: Diagnosis not present

## 2023-12-09 DIAGNOSIS — M79671 Pain in right foot: Secondary | ICD-10-CM | POA: Diagnosis not present

## 2024-02-08 DIAGNOSIS — I2510 Atherosclerotic heart disease of native coronary artery without angina pectoris: Secondary | ICD-10-CM | POA: Diagnosis not present

## 2024-02-08 DIAGNOSIS — Z01810 Encounter for preprocedural cardiovascular examination: Secondary | ICD-10-CM | POA: Diagnosis not present

## 2024-02-08 DIAGNOSIS — I10 Essential (primary) hypertension: Secondary | ICD-10-CM | POA: Diagnosis not present

## 2024-02-08 DIAGNOSIS — E1169 Type 2 diabetes mellitus with other specified complication: Secondary | ICD-10-CM | POA: Diagnosis not present

## 2024-02-08 DIAGNOSIS — E781 Pure hyperglyceridemia: Secondary | ICD-10-CM | POA: Diagnosis not present

## 2024-02-08 DIAGNOSIS — E785 Hyperlipidemia, unspecified: Secondary | ICD-10-CM | POA: Diagnosis not present

## 2024-02-11 DIAGNOSIS — I10 Essential (primary) hypertension: Secondary | ICD-10-CM | POA: Diagnosis not present

## 2024-02-11 DIAGNOSIS — M79671 Pain in right foot: Secondary | ICD-10-CM | POA: Diagnosis not present

## 2024-02-11 DIAGNOSIS — M21621 Bunionette of right foot: Secondary | ICD-10-CM | POA: Diagnosis not present
